# Patient Record
Sex: Male | Born: 1980 | Race: White | Hispanic: No | Marital: Married | Smoking: Current every day smoker
Health system: Southern US, Community
[De-identification: ages and names within clinical notes are randomized; demographics above are authoritative.]

## PROBLEM LIST (undated history)

## (undated) ENCOUNTER — Emergency Department (HOSPITAL_COMMUNITY): Admission: EM | Payer: Self-pay | Source: Home / Self Care

## (undated) DIAGNOSIS — T148XXA Other injury of unspecified body region, initial encounter: Secondary | ICD-10-CM

## (undated) HISTORY — PX: ABDOMINAL SURGERY: SHX537

---
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
## 2003-11-13 ENCOUNTER — Emergency Department (HOSPITAL_COMMUNITY): Admission: EM | Admit: 2003-11-13 | Discharge: 2003-11-13 | Payer: Self-pay | Admitting: Emergency Medicine

## 2005-11-20 ENCOUNTER — Emergency Department (HOSPITAL_COMMUNITY): Admission: EM | Admit: 2005-11-20 | Discharge: 2005-11-20 | Payer: Self-pay | Admitting: Family Medicine

## 2005-11-20 ENCOUNTER — Emergency Department (HOSPITAL_COMMUNITY): Admission: EM | Admit: 2005-11-20 | Discharge: 2005-11-21 | Payer: Self-pay | Admitting: Emergency Medicine

## 2005-11-24 ENCOUNTER — Emergency Department (HOSPITAL_COMMUNITY): Admission: EM | Admit: 2005-11-24 | Discharge: 2005-11-24 | Payer: Self-pay | Admitting: Emergency Medicine

## 2005-12-01 ENCOUNTER — Encounter: Admission: RE | Admit: 2005-12-01 | Discharge: 2005-12-01 | Payer: Self-pay | Admitting: Surgery

## 2006-01-05 ENCOUNTER — Emergency Department (HOSPITAL_COMMUNITY): Admission: EM | Admit: 2006-01-05 | Discharge: 2006-01-05 | Payer: Self-pay | Admitting: Emergency Medicine

## 2008-12-27 ENCOUNTER — Emergency Department (HOSPITAL_COMMUNITY): Admission: EM | Admit: 2008-12-27 | Discharge: 2008-12-27 | Payer: Self-pay | Admitting: Emergency Medicine

## 2009-07-04 ENCOUNTER — Emergency Department (HOSPITAL_COMMUNITY): Admission: EM | Admit: 2009-07-04 | Discharge: 2009-07-04 | Payer: Self-pay | Admitting: Emergency Medicine

## 2010-07-19 LAB — POCT I-STAT, CHEM 8
Creatinine, Ser: 0.8 mg/dL (ref 0.4–1.5)
Hemoglobin: 15 g/dL (ref 13.0–17.0)
Potassium: 3.5 mEq/L (ref 3.5–5.1)
TCO2: 25 mmol/L (ref 0–100)
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
## 2012-10-17 ENCOUNTER — Emergency Department (HOSPITAL_COMMUNITY)
Admission: EM | Admit: 2012-10-17 | Discharge: 2012-10-17 | Disposition: A | Discharge status: Home / Self Care | Payer: Medicaid Other | Attending: Emergency Medicine | Admitting: Emergency Medicine

## 2012-10-17 ENCOUNTER — Encounter (HOSPITAL_COMMUNITY): Payer: Self-pay

## 2012-10-17 DIAGNOSIS — F17200 Nicotine dependence, unspecified, uncomplicated: Secondary | ICD-10-CM | POA: Insufficient documentation

## 2012-10-17 DIAGNOSIS — K089 Disorder of teeth and supporting structures, unspecified: Secondary | ICD-10-CM | POA: Insufficient documentation

## 2012-10-17 DIAGNOSIS — K0889 Other specified disorders of teeth and supporting structures: Secondary | ICD-10-CM

## 2012-10-17 MED ORDER — AMOXICILLIN 500 MG PO CAPS: 500.0000 mg | ORAL_CAPSULE | Freq: Three times a day (TID) | ORAL | Status: DC

## 2012-10-17 MED ORDER — OXYCODONE-ACETAMINOPHEN 5-325 MG PO TABS: ORAL_TABLET | ORAL | Status: DC

## 2012-10-17 NOTE — ED Notes (Signed)
Lt. Upper  Dental pain for 1 week

## 2012-10-17 NOTE — ED Provider Notes (Signed)
History     CSN: [id number]  Arrival date & time 10/17/12  0720   First MD Initiated Contact with Patient 10/17/12 0730      Chief Complaint  Patient presents with  . Dental Pain    (Consider location/radiation/quality/duration/timing/severity/associated sxs/prior treatment) HPI  [name] is a 32 y.o. male complaining of left upper dental pain worsening over the course of the week. Patient denies fever, nausea vomiting, difficulty swallowing. He has taken Goody's powder, aspirin, ibuprofen, Tylenol with little relief. Rates his pain at 10 out of 10, but is exacerbated by chewing.   History reviewed. No pertinent past medical history.  Past Surgical History  Procedure Laterality Date  . Abdominal surgery      No family history on file.  History  Substance Use Topics  . Smoking status: Current Every Day Smoker    Types: Cigarettes  . Smokeless tobacco: Not on file  . Alcohol Use: Yes     Comment: occassional      Review of Systems  Allergies  Review of patient's allergies indicates no known allergies.  Home Medications  No current outpatient prescriptions on file.  BP 124/72  Pulse 115  Temp(Src) 98.9 F (37.2 C) (Oral)  Resp 20  SpO2 97%  Physical Exam  Nursing note and vitals reviewed. Constitutional: He is oriented to person, place, and time. He appears well-developed and well-nourished. No distress.  HENT:  Head: Normocephalic.  Mouth/Throat: Oropharynx is clear and moist.  Generally poor dentition, no gingival swelling, erythema or tenderness to palpation. Patient is handling their secretions. There is no tenderness to palpation or firmness underneath tongue bilaterally. No trismus.    Eyes: Conjunctivae and EOM are normal.  Neck: Normal range of motion. Neck supple.  Cardiovascular: Normal rate.   Pulmonary/Chest: Effort normal. No stridor.  Musculoskeletal: Normal range of motion.  Lymphadenopathy:    He has no cervical adenopathy.   Neurological: He is alert and oriented to person, place, and time.  Psychiatric: He has a normal mood and affect.    ED Course  Procedures (including critical care time)  Labs Reviewed - No data to display No results found.   1. Pain, dental       MDM   Filed Vitals:   10/17/12 0723  BP: 124/72  Pulse: 115  Temp: 98.9 F (37.2 C)  TempSrc: Oral  Resp: 20  SpO2: 97%     [name] is a 32 y.o. male Patient with toothache.  No gross abscess.  Exam unconcerning for Ludwig's angina or spread of infection.  Will treat with penicillin and pain medicine.  Urged patient to follow-up with dentist.    Pt is hemodynamically stable, appropriate for, and amenable to discharge at this time. Pt verbalized understanding and agrees with care plan. Outpatient follow-up and specific return precautions discussed.    New Prescriptions   AMOXICILLIN (AMOXIL) 500 MG CAPSULE    Take 1 capsule (500 mg total) by mouth 3 (three) times daily.   OXYCODONE-ACETAMINOPHEN (PERCOCET/ROXICET) 5-325 MG PER TABLET    1 to 2 tabs PO q6hrs  PRN for pain           Wynetta Emery, PA-C 10/17/12 0745

## 2012-10-19 NOTE — ED Provider Notes (Signed)
Medical screening examination/treatment/procedure(s) were performed by non-physician practitioner and as supervising physician I was immediately available for consultation/collaboration.  Derwood Kaplan, MD 10/19/12 0041

## 2013-07-18 ENCOUNTER — Encounter (HOSPITAL_COMMUNITY): Payer: Self-pay | Admitting: Emergency Medicine

## 2013-07-18 ENCOUNTER — Emergency Department (HOSPITAL_COMMUNITY)
Admission: EM | Admit: 2013-07-18 | Discharge: 2013-07-18 | Disposition: A | Discharge status: Home / Self Care | Payer: Medicaid Other | Attending: Emergency Medicine | Admitting: Emergency Medicine

## 2013-07-18 DIAGNOSIS — R51 Headache: Secondary | ICD-10-CM | POA: Insufficient documentation

## 2013-07-18 DIAGNOSIS — K089 Disorder of teeth and supporting structures, unspecified: Secondary | ICD-10-CM | POA: Insufficient documentation

## 2013-07-18 DIAGNOSIS — R000 Tachycardia, unspecified: Secondary | ICD-10-CM | POA: Insufficient documentation

## 2013-07-18 DIAGNOSIS — K029 Dental caries, unspecified: Secondary | ICD-10-CM | POA: Insufficient documentation

## 2013-07-18 DIAGNOSIS — K0889 Other specified disorders of teeth and supporting structures: Secondary | ICD-10-CM

## 2013-07-18 DIAGNOSIS — F17200 Nicotine dependence, unspecified, uncomplicated: Secondary | ICD-10-CM | POA: Insufficient documentation

## 2013-07-18 DIAGNOSIS — Z792 Long term (current) use of antibiotics: Secondary | ICD-10-CM | POA: Insufficient documentation

## 2013-07-18 MED ORDER — NAPROXEN 500 MG PO TABS: 500.0000 mg | ORAL_TABLET | Freq: Two times a day (BID) | ORAL | Status: AC

## 2013-07-18 MED ORDER — HYDROCODONE-ACETAMINOPHEN 5-325 MG PO TABS: ORAL_TABLET | ORAL | Status: AC
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
## 2013-07-18 NOTE — ED Notes (Signed)
C/o L lower toothache. Onset 2-3d ago. Mentions broken tooth and some swelling. (denies: fever, nv, drainage or other sx).

## 2013-07-18 NOTE — Discharge Instructions (Signed)
Please read and follow all provided instructions.  Your diagnoses today include:  1. Pain, dental    The exam and treatment you received today has been provided on an emergency basis only. This is not a substitute for complete medical or dental care.  Tests performed today include:  Vital signs. See below for your results today.   Medications prescribed:   Vicodin (hydrocodone/acetaminophen) - narcotic pain medication  DO NOT drive or perform any activities that require you to be awake and alert because this medicine can make you drowsy. BE VERY CAREFUL not to take multiple medicines containing Tylenol (also called acetaminophen). Doing so can lead to an overdose which can damage your liver and cause liver failure and possibly death.   Naproxen - anti-inflammatory pain medication  Do not exceed 500mg  naproxen every 12 hours, take with food  You have been prescribed an anti-inflammatory medication or NSAID. Take with food. Take smallest effective dose for the shortest duration needed for your pain. Stop taking if you experience stomach pain or vomiting.   Take any prescribed medications only as directed.  Home care instructions:  Follow any educational materials contained in this packet.  Follow-up instructions: Please follow-up with your dentist for further evaluation of your symptoms. If you do not have a dentist or primary care doctor -- see below for referral information.   Dental Assistance: See below for dental referrals  Return instructions:   Please return to the Emergency Department if you experience worsening symptoms.  Please return if you develop a fever, you develop more swelling in your face or neck, you have trouble breathing or swallowing food.  Please return if you have any other emergent concerns.  Additional Information:  Your vital signs today were: BP 119/67   Pulse 112   Temp(Src) 98.3 F (36.8 C) (Oral)   Resp 18   Ht 5\' 10"  (1.778 m)   Wt 145 lb  (65.772 kg)   BMI 20.81 kg/m2   SpO2 97% If your blood pressure (BP) was elevated above 135/85 this visit, please have this repeated by your doctor within one month. -------------- Dental Care: Organization         Address  Phone  Notes  [facility] Department of [facility] [address] [place], [place] [phone] Accepts children up to age 47 who are enrolled in [place] or [place] Health Choice; pregnant women with a Medicaid card; and children who have applied for Medicaid or [place] Health Choice, but were declined, whose parents can pay a reduced fee at time of service.  [facility] Department of [facility]  [address], [place] [phone] Accepts children up to age 22 who are enrolled in [place] or [place] Health Choice; pregnant women with a Medicaid card; and children who have applied for Medicaid or  Health Choice, but were declined, whose parents can pay a reduced fee at time of service.  [facility]  [address] [place], [place] [phone] Patients are seen by appointment only. Walk-ins are not accepted. [facility] will see patients 42 years of age and older. Monday - Tuesday (8am-5pm) Most Wednesdays (8:30-5pm) $30 per visit, cash only  [facility]  [address], [facility] [phone] Patients are seen by appointment only. Walk-ins are not accepted. [facility] will see patients 83 years of age and older. One Wednesday Evening (Monthly: Volunteer Based).  $30 per visit,  cash only  [employer] [facility] [employer] [facility]  [phone] for adults; Children under age 294, call Graduate Pediatric Dentistry at [phone]. Children aged 284-14, please call [phone] to request a pediatric application.  Dental services are provided in all areas of dental care including fillings, crowns and bridges, complete and partial dentures, implants, gum treatment, root  canals, and extractions. Preventive care is also provided. Treatment is provided to both adults and children. Patients are selected via a lottery and there is often a waiting list.   [facility] [address], [place]  [phone] [email or web address]   [facility] [address], [place], [place] [phone], Ext. [phone] Second and Fourth Thursday of each month, opens at 6:30 AM; Clinic ends at 9 AM.  Patients are seen on a first-come first-served basis, and a limited number are seen during each clinic.   [facility]  [address], [place], [place] [phone]   Eligibility Requirements You must have lived in [place], [place], or [place] counties for at least the last three months.   You cannot be eligible for state or federal sponsored [place]care insurance, including [employer], [place], or [employer]are.   You generally cannot be eligible for healthcare insurance through your employer.    How to apply: Eligibility screenings are held every Tuesday and Wednesday afternoon from 1:00 pm until 4:00 pm. You do not need an appointment for the interview!  [facility] [address], [place], [place] [phone]   [facility]  [phone]   [facility]  [phone]   [facility]  [phone]

## 2013-07-18 NOTE — ED Notes (Signed)
Pt A&Ox4, ambulatory at discharge, verbalizing no complaints at this time. 

## 2013-07-18 NOTE — ED Provider Notes (Signed)
Medical screening examination/treatment/procedure(s) were performed by non-physician practitioner and as supervising physician I was immediately available for consultation/collaboration.  Britainy Kozub, MD 07/18/13 0331 

## 2013-07-18 NOTE — ED Notes (Signed)
Josh, PA and student PA at bedside.

## 2015-03-20 ENCOUNTER — Emergency Department (EMERGENCY_DEPARTMENT_HOSPITAL): Payer: Self-pay

## 2015-03-20 ENCOUNTER — Emergency Department
Admission: EM | Admit: 2015-03-20 | Discharge: 2015-03-20 | Payer: Self-pay | Attending: Emergency Medicine | Admitting: Emergency Medicine

## 2015-03-20 DIAGNOSIS — F10129 Alcohol abuse with intoxication, unspecified: Secondary | ICD-10-CM | POA: Insufficient documentation

## 2015-03-20 DIAGNOSIS — S0011XA Contusion of right eyelid and periocular area, initial encounter: Secondary | ICD-10-CM | POA: Insufficient documentation

## 2015-03-20 DIAGNOSIS — Y908 Blood alcohol level of 240 mg/100 ml or more: Secondary | ICD-10-CM | POA: Insufficient documentation

## 2015-03-20 DIAGNOSIS — S0240DA Maxillary fracture, left side, initial encounter for closed fracture: Secondary | ICD-10-CM | POA: Insufficient documentation

## 2015-03-20 DIAGNOSIS — S022XXA Fracture of nasal bones, initial encounter for closed fracture: Secondary | ICD-10-CM | POA: Insufficient documentation

## 2015-03-20 DIAGNOSIS — S0240CA Maxillary fracture, right side, initial encounter for closed fracture: Secondary | ICD-10-CM | POA: Insufficient documentation

## 2015-03-20 DIAGNOSIS — S0993XA Unspecified injury of face, initial encounter: Secondary | ICD-10-CM

## 2015-03-20 DIAGNOSIS — S0081XA Abrasion of other part of head, initial encounter: Secondary | ICD-10-CM | POA: Insufficient documentation

## 2015-03-20 LAB — BASIC METABOLIC PANEL
ANION GAP: 12 mmol/L (ref 4–13)
ANION GAP: 12 mmol/L (ref 4–13)
BUN/CREA RATIO: 22 (ref 6–22)
BUN: 19 mg/dL (ref 8–25)
CALCIUM: 9 mg/dL (ref 8.5–10.4)
CHLORIDE: 107 mmol/L (ref 96–111)
CO2 TOTAL: 19 mmol/L — ABNORMAL LOW (ref 22–32)
CREATININE: 0.87 mg/dL (ref 0.62–1.27)
ESTIMATED GFR: >59 mL/min/1.73mˆ2 (ref >59)
GLUCOSE: 125 mg/dL (ref 65–139)
POTASSIUM: 3.8 mmol/L (ref 3.5–5.1)
SODIUM: 138 mmol/L (ref 136–145)

## 2015-03-20 LAB — CBC WITH DIFF
BASOPHIL #: 0.14 x10?3/uL (ref 0.00–0.20)
BASOPHIL %: 1 %
EOSINOPHIL #: 0.22 x10?3/uL (ref 0.00–0.50)
EOSINOPHIL %: 2 %
HCT: 45.7 % (ref 36.7–47.0)
HGB: 15.3 g/dL (ref 12.5–16.3)
LYMPHOCYTE #: 2.44 x10?3/uL (ref 1.00–4.80)
LYMPHOCYTE %: 16 %
MCH: 31 pg (ref 27.4–33.0)
MCHC: 33.5 g/dL (ref 32.5–35.8)
MCV: 92.5 fL (ref 78.0–100.0)
MONOCYTE #: 0.93 x10?3/uL (ref 0.30–1.00)
MONOCYTE %: 6 %
MPV: 8.2 fL (ref 7.5–11.5)
NEUTROPHIL #: 11.19 10*3/uL (ref 1.50–7.70)
NEUTROPHIL #: 11.19 x10ˆ3/uL — ABNORMAL HIGH (ref 1.50–7.70)
NEUTROPHIL %: 75 %
PLATELETS: 225 x10ˆ3/uL (ref 140–450)
RBC: 4.94 x10ˆ6/uL (ref 4.06–5.63)
RDW: 13.6 % (ref 12.0–15.0)
WBC: 14.9 x10ˆ3/uL — ABNORMAL HIGH (ref 3.5–11.0)

## 2015-03-20 LAB — PT/INR
INR: 0.85 (ref 0.80–1.20)
PROTHROMBIN TIME: 9.5 s (ref 9.0–13.4)

## 2015-03-20 LAB — PTT (PARTIAL THROMBOPLASTIN TIME): APTT: 26.4 s (ref 25.1–36.5)

## 2015-03-20 LAB — ETHANOL, SERUM/PLASMA: ETHANOL: 264 mg/dL (ref <10)

## 2015-03-20 MED ORDER — OXYCODONE 5 MG TABLET
5.00 mg | ORAL_TABLET | Freq: Four times a day (QID) | ORAL | Status: AC | PRN
Start: 2015-03-20 — End: ?

## 2015-03-20 NOTE — ED Nurses Note (Signed)
Patient rounded on, resting quietly, visible rise/fall of chest. Officer at bedside,  Right arm in cuff. Will continue to monitor.
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
## 2015-03-20 NOTE — ED Nurses Note (Signed)
Patient rounded on, resting quietly, visible rise/fall of chest. Officer at bedside, cuff on right wrist. Call light within reach. Will continue to monitor.

## 2015-03-20 NOTE — ED Nurses Note (Signed)
Patient resting, without complaints, vital signs stable. Officer at the bedside. Will continue to monitor.
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
## 2015-03-20 NOTE — ED Nurses Note (Signed)
Patient rounded on, resting quietly, visible rise/fall of chest. Officer at bedside, cuff on right wrist. Will continue to monitor.

## 2015-03-20 NOTE — Discharge Instructions (Signed)
Thank you for visiting the Emergency Department at [facility]. You should return to the ER if you develop new, worsening, or concerning symptoms.     Today you were evaluated for facial injuries.  Your lab work showed no significant findings. Your imaging showed a comminuted nasal bone fracture. You were seen by Emergency and ENT (ear nose throat) physicians. Attached you may find additional information.  You have been prescribed:  Oxycodone, please do not drive while taking this medication. Please do not take any until this evening (11/22) after 6 PM  You may take over the counter tylenol for pain control    Please do not blow your nose, no nasal sprays, no instruments/finger/etc up the nose.      Please follow up with:  ENT on Monday 11/28 or Tuesday 11/29    Have a great day!
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
## 2015-04-09 ENCOUNTER — Encounter (INDEPENDENT_AMBULATORY_CARE_PROVIDER_SITE_OTHER): Payer: Self-pay | Admitting: Otolaryngology

## 2019-02-10 ENCOUNTER — Emergency Department (HOSPITAL_COMMUNITY)
Admission: EM | Admit: 2019-02-10 | Discharge: 2019-02-10 | Payer: Medicaid - Out of State | Attending: Emergency Medicine | Admitting: Emergency Medicine

## 2019-02-10 ENCOUNTER — Encounter (HOSPITAL_COMMUNITY): Payer: Self-pay

## 2019-02-10 ENCOUNTER — Emergency Department (HOSPITAL_COMMUNITY): Payer: Medicaid - Out of State

## 2019-02-10 ENCOUNTER — Other Ambulatory Visit: Payer: Self-pay

## 2019-02-10 DIAGNOSIS — R0781 Pleurodynia: Secondary | ICD-10-CM | POA: Diagnosis present

## 2019-02-10 DIAGNOSIS — W19XXXA Unspecified fall, initial encounter: Secondary | ICD-10-CM

## 2019-02-10 DIAGNOSIS — F17210 Nicotine dependence, cigarettes, uncomplicated: Secondary | ICD-10-CM | POA: Insufficient documentation

## 2019-02-10 HISTORY — DX: Other injury of unspecified body region, initial encounter: T14.8XXA

## 2019-02-10 MED ORDER — LIDOCAINE 5 % EX PTCH
1.0000 | MEDICATED_PATCH | CUTANEOUS | Status: DC
  Administered 2019-02-10: 19:00:00 1 via TRANSDERMAL
  Filled 2019-02-10: qty 1

## 2019-02-10 MED ORDER — ACETAMINOPHEN 325 MG PO TABS
650.0000 mg | ORAL_TABLET | Freq: Four times a day (QID) | ORAL | 0 refills | Status: AC | PRN
Start: 2019-02-10 — End: ?

## 2019-02-10 MED ORDER — IBUPROFEN 400 MG PO TABS: 400.0000 mg | ORAL_TABLET | Freq: Four times a day (QID) | ORAL | 0 refills | Status: AC | PRN

## 2019-02-10 MED ORDER — IBUPROFEN 200 MG PO TABS
600.0000 mg | ORAL_TABLET | Freq: Once | ORAL | Status: AC
  Administered 2019-02-10: 19:00:00 600 mg via ORAL
  Filled 2019-02-10: qty 3

## 2019-02-10 NOTE — ED Notes (Signed)
Patient transported to X-ray 
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
## 2019-02-10 NOTE — Discharge Instructions (Addendum)
X-ray showed no signs of broken ribs.  This is likely a bad bruise.  I would continue to apply ice to the area to help with swelling and pain.  Can take Motrin and Tylenol every 4-6 hours for pain.  Use the incentive spirometer as provided to help with your breathing so that you not develop pneumonia.  If you develop any abdominal pain, blood in your urine, stool return to the ER immediately.

## 2020-03-07 IMAGING — CR DG RIBS W/ CHEST 3+V*L*
5 series · 5 of 5 positions shown · non-contrast
Comparison: 01/05/2006

CLINICAL DATA: Fall from ladder, left chest and rib pain, shortness
of breath

EXAM:
LEFT RIBS AND CHEST - 3+ VIEW

[w chest pa (1 of 2)]
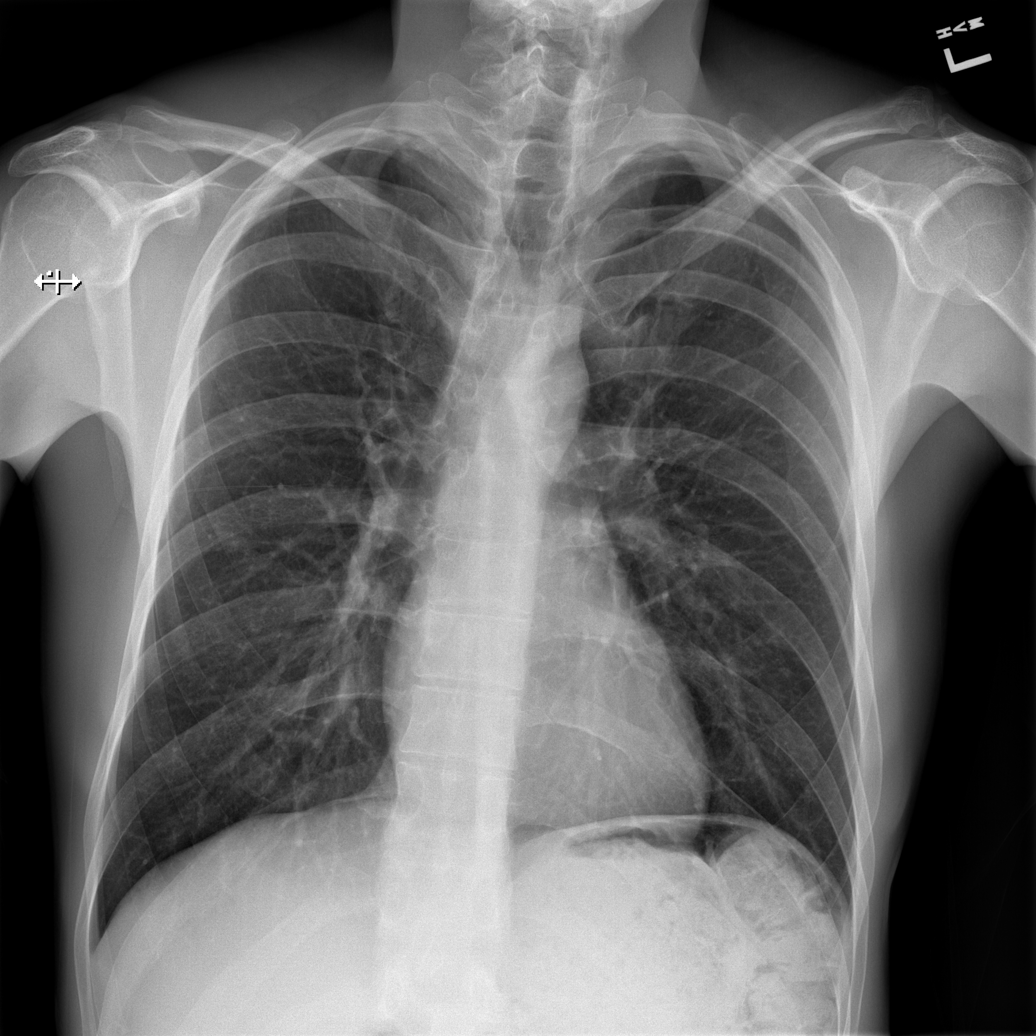

[w chest pa (2 of 2)]
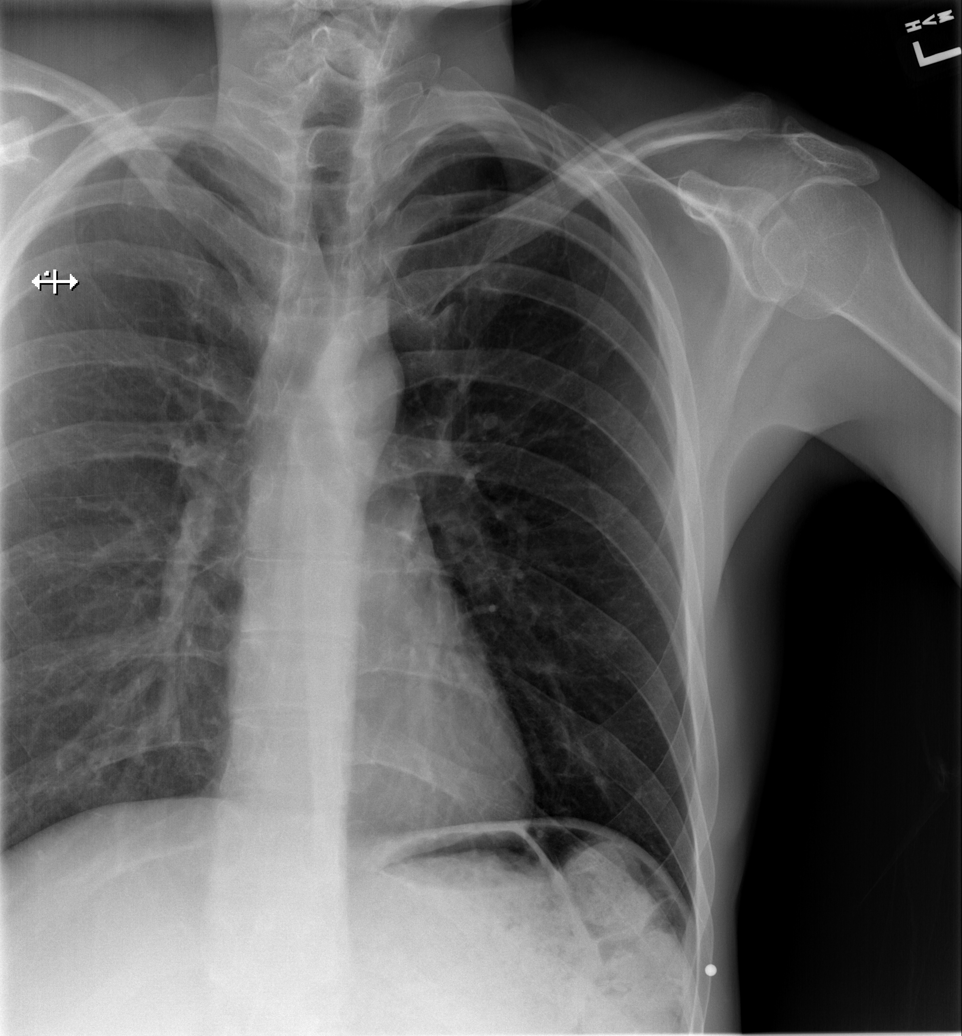

[w ribs ap lower left]
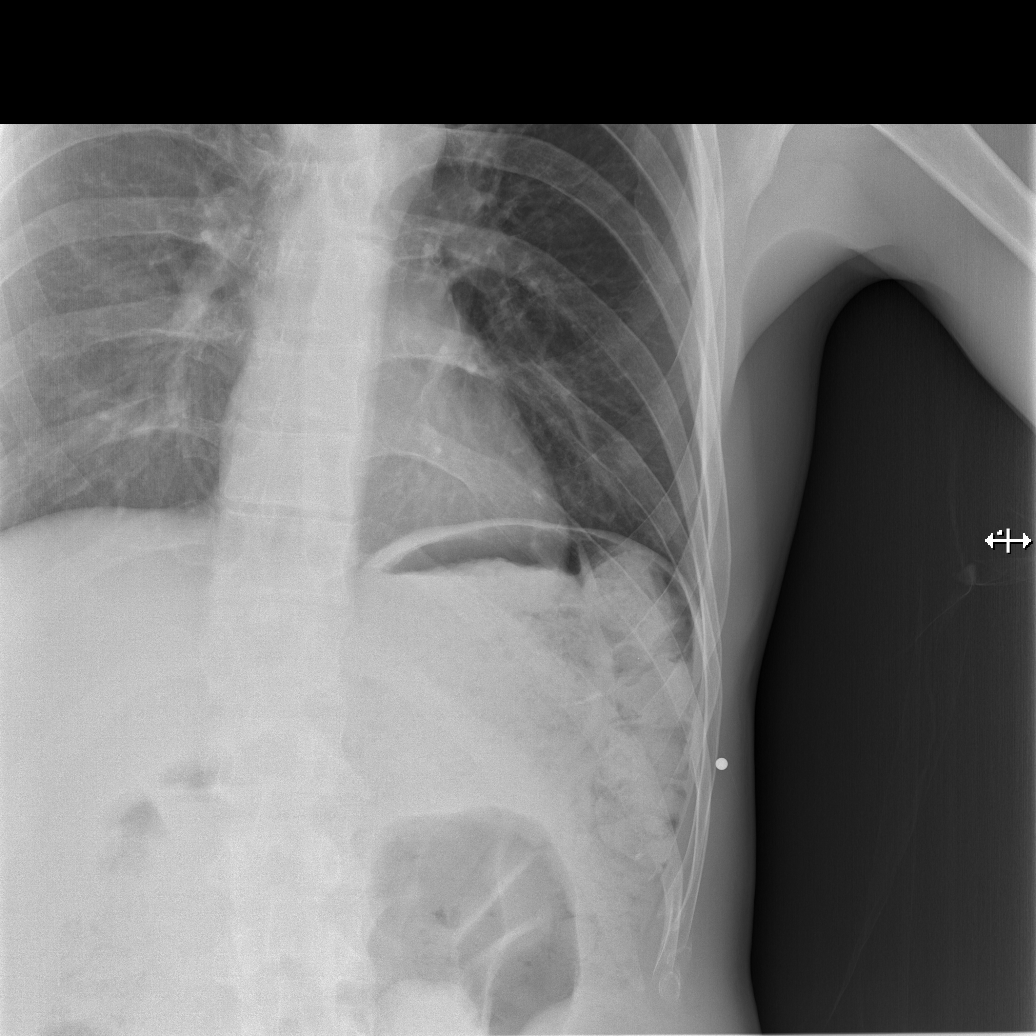

[w ribs obl left (1 of 2)]
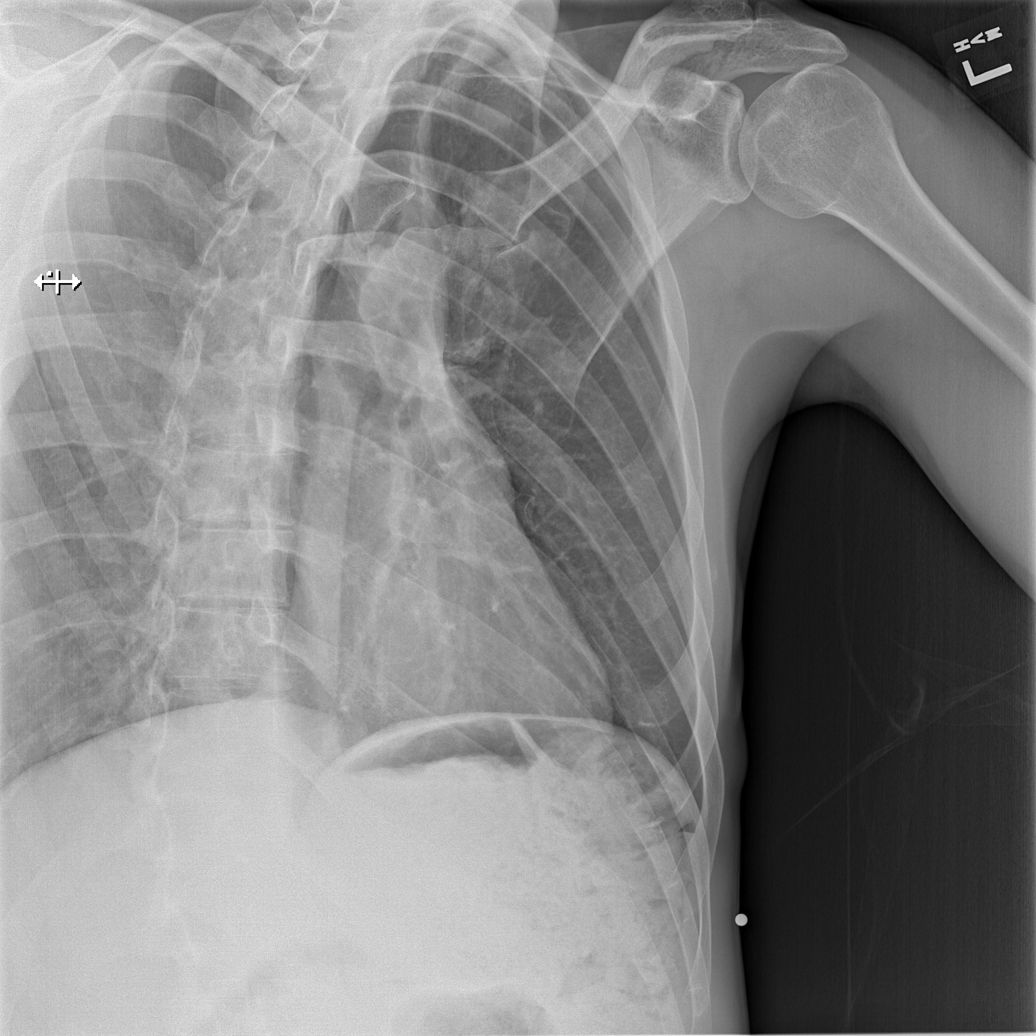

[w ribs obl left (2 of 2)]
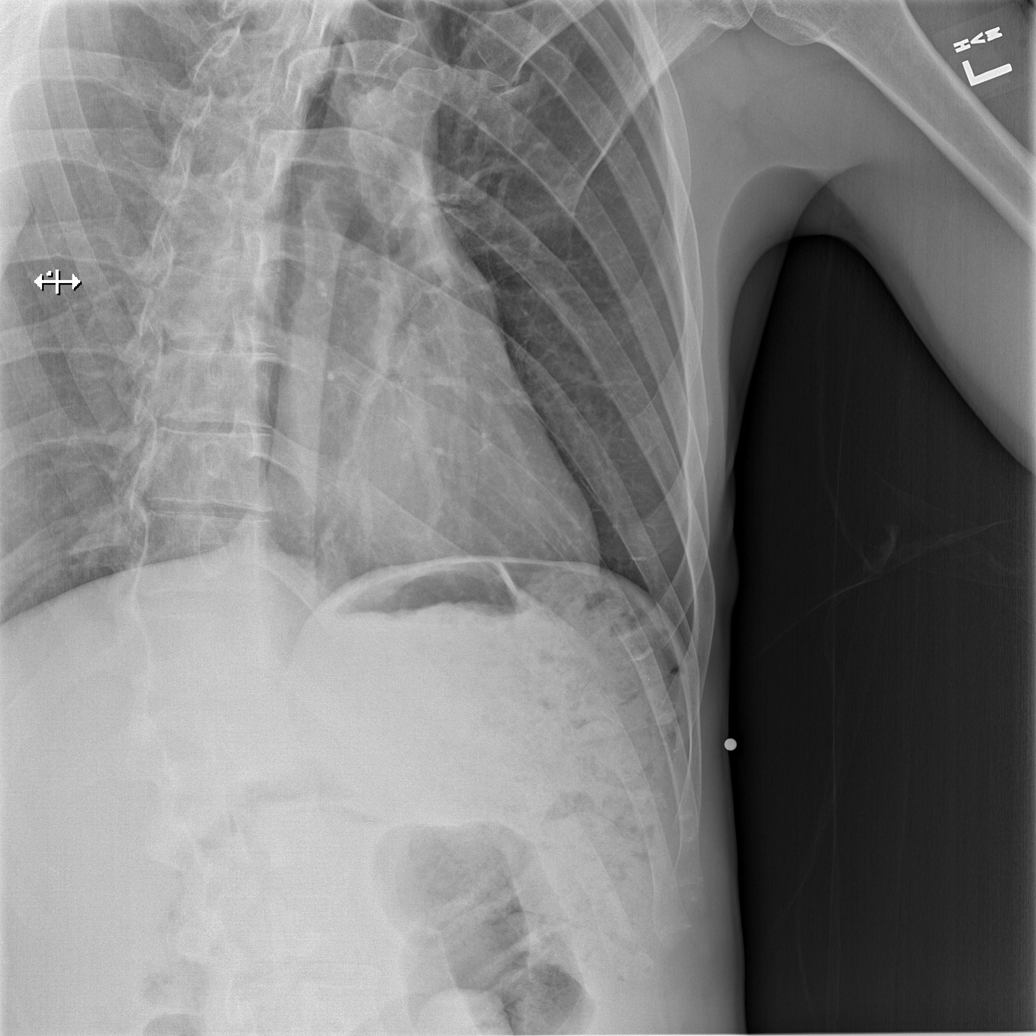

[5 of 5 positions shown; findings below may reference images not displayed]

FINDINGS: No fracture or other bone lesions are seen involving the ribs. There
is no evidence of pneumothorax or pleural effusion. Both lungs are
clear. Heart size and mediastinal contours are within normal limits.
IMPRESSION: Negative.
